# Patient Record
Sex: Female | Born: 1990 | Race: Black or African American | Marital: Single | Smoking: Never smoker
Health system: Southern US, Community
[De-identification: ages and names within clinical notes are randomized; demographics above are authoritative.]

---

## 2012-09-01 ENCOUNTER — Emergency Department (HOSPITAL_COMMUNITY)
Admission: EM | Admit: 2012-09-01 | Discharge: 2012-09-01 | Disposition: A | Discharge status: Home / Self Care | Attending: Emergency Medicine | Admitting: Emergency Medicine

## 2012-09-01 DIAGNOSIS — Z3202 Encounter for pregnancy test, result negative: Secondary | ICD-10-CM | POA: Insufficient documentation

## 2012-09-01 DIAGNOSIS — N939 Abnormal uterine and vaginal bleeding, unspecified: Secondary | ICD-10-CM

## 2012-09-01 DIAGNOSIS — N898 Other specified noninflammatory disorders of vagina: Secondary | ICD-10-CM | POA: Insufficient documentation

## 2012-09-01 DIAGNOSIS — N390 Urinary tract infection, site not specified: Secondary | ICD-10-CM | POA: Insufficient documentation

## 2012-09-01 LAB — URINALYSIS, ROUTINE W REFLEX MICROSCOPIC
Bilirubin Urine: NEGATIVE
Ketones, ur: NEGATIVE mg/dL
Nitrite: POSITIVE — AB
Protein, ur: 100 mg/dL — AB

## 2012-09-01 LAB — WET PREP, GENITAL: WBC, Wet Prep HPF POC: NONE SEEN

## 2012-09-01 MED ORDER — CEPHALEXIN 500 MG PO CAPS: 500.0000 mg | ORAL_CAPSULE | Freq: Two times a day (BID) | ORAL | Status: DC

## 2012-09-01 NOTE — ED Notes (Signed)
Pt st's she and her female partner was using a dildo and pt started having vaginal bleeding afterwards.

## 2012-09-01 NOTE — ED Notes (Signed)
The patient is AOx4 and comfortable with the discharge instructions. 

## 2012-09-01 NOTE — ED Notes (Signed)
Pt c/o pain when voiding and blood when on tissue when wiping. Pt states it started as a little bit of blood and has increased to moderate amount.

## 2012-09-02 LAB — GC/CHLAMYDIA PROBE AMP: GC Probe RNA: NEGATIVE

## 2012-09-02 LAB — URINE CULTURE: Culture: NO GROWTH

## 2012-09-02 NOTE — ED Provider Notes (Signed)
History     CSN: [id number]  Arrival date & time 09/01/12  2136   First MD Initiated Contact with Patient 09/01/12 2209      Chief Complaint  Patient presents with  . Vaginal Bleeding     HPI 22 year old female, last menstrual period about 3 weeks ago presents complaining of vaginal bleeding. Onset was about 5 days ago. She's been having minimal vaginal bleeding described as a small amount of blood when she wipes that has gradually increased but is still less than normal menstrual period. She has no pain. She reports that she is worried that she's injured herself. She is sexually active with females only and states that she had vaginal penetration with a "sexual toy" 2 days ago and that's when her bleeding started.  She denies abdominal pain, nausea, vomiting, dysuria, urinary frequency, urinary urgency, back pain, or any other symptoms.   No past medical history on file.  No past surgical history on file.  No family history on file.  History  Substance Use Topics  . Smoking status: Not on file  . Smokeless tobacco: Not on file  . Alcohol Use: Not on file    OB History   No data available      Review of Systems  Constitutional: Negative for fever, chills and diaphoresis.  HENT: Negative for congestion, rhinorrhea, neck pain and neck stiffness.   Respiratory: Negative for cough, shortness of breath and wheezing.   Cardiovascular: Negative for chest pain and leg swelling.  Gastrointestinal: Negative for nausea, vomiting, abdominal pain and diarrhea.  Genitourinary: Positive for vaginal bleeding. Negative for dysuria, urgency, frequency, flank pain, vaginal discharge and difficulty urinating.  Skin: Negative for rash.  Neurological: Negative for weakness, numbness and headaches.  All other systems reviewed and are negative.    Allergies  Review of patient's allergies indicates no known allergies.  Home Medications   Current Outpatient Rx  Name  Route  Sig   Dispense  Refill  . cephALEXin (KEFLEX) 500 MG capsule   Oral   Take 1 capsule (500 mg total) by mouth 2 (two) times daily.   28 capsule   0     For 14 days     BP 123/73  Pulse 79  Temp(Src) 98.4 F (36.9 C) (Oral)  Resp 16  SpO2 100%  LMP 08/15/2012  Physical Exam  Nursing note and vitals reviewed. Constitutional: She is oriented to person, place, and time. She appears well-developed and well-nourished. No distress.  HENT:  Head: Normocephalic and atraumatic.  Mouth/Throat: Oropharynx is clear and moist.  Eyes: Conjunctivae and EOM are normal. Pupils are equal, round, and reactive to light. No scleral icterus.  Neck: Normal range of motion. Neck supple. No JVD present.  Cardiovascular: Normal rate, regular rhythm, normal heart sounds and intact distal pulses.  Exam reveals no gallop and no friction rub.   No murmur heard. Pulmonary/Chest: Effort normal and breath sounds normal. No respiratory distress. She has no wheezes. She has no rales.  Abdominal: Soft. Bowel sounds are normal. She exhibits no distension. There is no tenderness. There is no rebound and no guarding.  Genitourinary: There is no lesion or injury on the right labia. There is no lesion or injury on the left labia. Cervix exhibits no motion tenderness, no discharge and no friability. Right adnexum displays no mass, no tenderness and no fullness. Left adnexum displays no mass, no tenderness and no fullness. There is bleeding (minimal) around the vagina. No erythema or tenderness  around the vagina. No foreign body around the vagina. No signs of injury around the vagina. No vaginal discharge found.  Musculoskeletal: She exhibits no edema.  Neurological: She is alert and oriented to person, place, and time. No cranial nerve deficit. She exhibits normal muscle tone. Coordination normal.  Skin: Skin is warm and dry. She is not diaphoretic.    ED Course  Procedures (including critical care time)  Labs Reviewed  WET  PREP, GENITAL - Abnormal; Notable for the following:    Clue Cells Wet Prep HPF POC FEW (*)    All other components within normal limits  URINALYSIS, ROUTINE W REFLEX MICROSCOPIC - Abnormal; Notable for the following:    APPearance TURBID (*)    Hgb urine dipstick LARGE (*)    Protein, ur 100 (*)    Nitrite POSITIVE (*)    Leukocytes, UA LARGE (*)    All other components within normal limits  URINE MICROSCOPIC-ADD ON - Abnormal; Notable for the following:    Bacteria, UA MANY (*)    All other components within normal limits  URINE CULTURE  GC/CHLAMYDIA PROBE AMP  POCT PREGNANCY, URINE   No results found.   1. Vaginal bleeding   2. UTI (lower urinary tract infection)       MDM   22 year old female presents complaining of vaginal bleeding after she had vaginal penetration with a "sexual toy." She has mild bleeding. No pain. No other symptoms.  On exam she is well-appearing in no acute distress.  patient has normal vital signs. Her abdominal exam is benign. Her vaginal exam demonstrates no injury with normal vaginal mucosa and minimal blood in the vaginal vault.  Pregnancy test negative. UA demonstrates findings concerning for UTI.  She is treated with Keflex and given a prescription for the same and advised to followup with her gynecologist for reevaluation.        Toney Sang, MD 09/02/12 1301

## 2012-09-02 NOTE — ED Provider Notes (Signed)
I have personally seen and examined the patient.  I have discussed the plan of care with the resident.  I have reviewed the documentation on PMH/FH/Soc. History.  I have reviewed the documentation of the resident and agree.  pt well appearing, no distress, she feels comfortable for d/c home and will be treated for uti   Joya Gaskins, MD 09/02/12 2338

## 2016-05-26 ENCOUNTER — Encounter (HOSPITAL_COMMUNITY): Payer: Self-pay | Admitting: Emergency Medicine

## 2016-05-26 ENCOUNTER — Emergency Department (HOSPITAL_COMMUNITY): Payer: Self-pay

## 2016-05-26 ENCOUNTER — Emergency Department (HOSPITAL_COMMUNITY)
Admission: EM | Admit: 2016-05-26 | Discharge: 2016-05-27 | Disposition: A | Discharge status: Home / Self Care | Payer: Self-pay | Attending: Emergency Medicine | Admitting: Emergency Medicine

## 2016-05-26 DIAGNOSIS — R109 Unspecified abdominal pain: Secondary | ICD-10-CM

## 2016-05-26 DIAGNOSIS — R1084 Generalized abdominal pain: Secondary | ICD-10-CM | POA: Insufficient documentation

## 2016-05-26 LAB — URINALYSIS, ROUTINE W REFLEX MICROSCOPIC
Bilirubin Urine: NEGATIVE
Glucose, UA: NEGATIVE mg/dL
HGB URINE DIPSTICK: NEGATIVE
Ketones, ur: 5 mg/dL — AB
Leukocytes, UA: NEGATIVE
NITRITE: NEGATIVE
PROTEIN: 30 mg/dL — AB
SPECIFIC GRAVITY, URINE: 1.027 (ref 1.005–1.030)
pH: 5 (ref 5.0–8.0)

## 2016-05-26 LAB — CBC
HCT: 38 % (ref 36.0–46.0)
HEMOGLOBIN: 12.1 g/dL (ref 12.0–15.0)
MCH: 21.6 pg — ABNORMAL LOW (ref 26.0–34.0)
MCHC: 31.8 g/dL (ref 30.0–36.0)
MCV: 67.7 fL — ABNORMAL LOW (ref 78.0–100.0)
Platelets: 310 10*3/uL (ref 150–400)
RBC: 5.61 MIL/uL — ABNORMAL HIGH (ref 3.87–5.11)
RDW: 17.3 % — AB (ref 11.5–15.5)
WBC: 13.5 10*3/uL — ABNORMAL HIGH (ref 4.0–10.5)

## 2016-05-26 LAB — I-STAT BETA HCG BLOOD, ED (MC, WL, AP ONLY)

## 2016-05-26 LAB — COMPREHENSIVE METABOLIC PANEL
ALBUMIN: 4.4 g/dL (ref 3.5–5.0)
ALK PHOS: 79 U/L (ref 38–126)
ALT: 11 U/L — AB (ref 14–54)
AST: 17 U/L (ref 15–41)
Anion gap: 6 (ref 5–15)
BILIRUBIN TOTAL: 0.4 mg/dL (ref 0.3–1.2)
BUN: 9 mg/dL (ref 6–20)
CALCIUM: 9 mg/dL (ref 8.9–10.3)
CO2: 26 mmol/L (ref 22–32)
Chloride: 104 mmol/L (ref 101–111)
Creatinine, Ser: 0.77 mg/dL (ref 0.44–1.00)
GFR calc Af Amer: >60 mL/min (ref >60)
GFR calc non Af Amer: >60 mL/min (ref >60)
GLUCOSE: 93 mg/dL (ref 65–99)
Potassium: 3.5 mmol/L (ref 3.5–5.1)
SODIUM: 136 mmol/L (ref 135–145)
TOTAL PROTEIN: 8.5 g/dL — AB (ref 6.5–8.1)

## 2016-05-26 LAB — LIPASE, BLOOD: Lipase: 24 U/L (ref 11–51)

## 2016-05-26 MED ORDER — IOPAMIDOL (ISOVUE-300) INJECTION 61%
100.0000 mL | Freq: Once | INTRAVENOUS | Status: AC | PRN
  Administered 2016-05-26: 100 mL via INTRAVENOUS

## 2016-05-26 MED ORDER — IOPAMIDOL (ISOVUE-300) INJECTION 61%
INTRAVENOUS | Status: AC
  Filled 2016-05-26: qty 100

## 2016-05-26 MED ORDER — SODIUM CHLORIDE 0.9 % IJ SOLN
INTRAMUSCULAR | Status: AC
  Filled 2016-05-26: qty 50

## 2016-05-26 NOTE — ED Triage Notes (Signed)
Pt complaint of generalized abdominal pain WITHOUT n/v/d onset last night; denies GU symptoms.

## 2016-05-26 NOTE — ED Provider Notes (Signed)
[facility]-EMERGENCY DEPT Provider Note   CSN: [id number] Arrival date & time: 05/26/16  1447  By signing my name below, I, Holly Case, attest that this documentation has been prepared under the direction and in the presence of non-physician practitioner, Trixie Dredge, PA-C. Electronically Signed: Nelwyn Case, Scribe. 05/26/2016. 10:23 PM.  History   Chief Complaint Chief Complaint  Patient presents with  . Abdominal Pain   The history is provided by the patient. No language interpreter was used.    HPI Comments:  Holly Case is an otherwise healthy 26 y.o. female who presents to the Emergency Department complaining of constant, mild generalized abdominal pain beginning last night. Pt states that her pain is worse directly above her bellybutton and exacerbated on palpation and when she bends forward. She reports associated urinary frequency. Pt has not tried anything at home for her symptoms. She denies any fevers, chills, myalgias, nausea, vomiting, dysuria, vaginal discharge, vaginal bleeding, diarrhea or constipation. Pt also denies any lifestyle changes or recent trauma to the area. Her LNMP was in December 2017, which she states is normal for her. Pt has no abdominal pshx.   History reviewed. No pertinent past medical history.  There are no active problems to display for this patient.   History reviewed. No pertinent surgical history.  OB History    No data available       Home Medications    Prior to Admission medications   Medication Sig Start Date End Date Taking? Authorizing Provider  cephALEXin (KEFLEX) 500 MG capsule Take 1 capsule (500 mg total) by mouth 2 (two) times daily. Patient not taking: Reported on 05/26/2016 09/01/12   Toney Sang, MD    Family History No family history on file.  Social History Social History  Substance Use Topics  . Smoking status: Never Smoker  . Smokeless tobacco: Not on file  . Alcohol use No     Allergies   Patient has no  known allergies.   Review of Systems Review of Systems  Constitutional: Negative for chills and fever.  Gastrointestinal: Positive for abdominal pain. Negative for constipation, diarrhea, nausea and vomiting.  Genitourinary: Positive for frequency. Negative for dysuria, vaginal bleeding and vaginal discharge.  Musculoskeletal: Negative for myalgias.  All other systems reviewed and are negative.    Physical Exam Updated Vital Signs BP 134/84 (BP Location: Right Arm)   Pulse 91   Temp 98.1 F (36.7 C) (Oral)   Resp 18   Wt 101.6 kg   LMP 04/02/2016   SpO2 100%   Physical Exam  Constitutional: She appears well-developed and well-nourished. No distress.  HENT:  Head: Normocephalic and atraumatic.  Neck: Neck supple.  Cardiovascular: Normal rate and regular rhythm.   Pulmonary/Chest: Effort normal and breath sounds normal. No respiratory distress. She has no wheezes. She has no rales.  Abdominal: Soft. She exhibits no distension. There is tenderness in the right upper quadrant and right lower quadrant. There is no rebound and no guarding.  Generalized abdominal pain worse on RLQ.   Neurological: She is alert.  Skin: She is not diaphoretic.  Nursing note and vitals reviewed.    ED Treatments / Results  DIAGNOSTIC STUDIES:  Oxygen Saturation is 100% on RA, normal by my interpretation.    COORDINATION OF CARE:  10:28 PM Discussed treatment plan with pt at bedside which includes urinalysis and imaging and pt agreed to plan.  12:10 AM Discussed CT results and blood work with patient.  Labs (all labs ordered are  listed, but only abnormal results are displayed) Labs Reviewed  COMPREHENSIVE METABOLIC PANEL - Abnormal; Notable for the following:       Result Value   Total Protein 8.5 (*)    ALT 11 (*)    All other components within normal limits  CBC - Abnormal; Notable for the following:    WBC 13.5 (*)    RBC 5.61 (*)    MCV 67.7 (*)    MCH 21.6 (*)    RDW 17.3 (*)     All other components within normal limits  URINALYSIS, ROUTINE W REFLEX MICROSCOPIC - Abnormal; Notable for the following:    APPearance CLOUDY (*)    Ketones, ur 5 (*)    Protein, ur 30 (*)    Bacteria, UA RARE (*)    Squamous Epithelial / LPF 6-30 (*)    All other components within normal limits  URINE CULTURE  LIPASE, BLOOD  I-STAT BETA HCG BLOOD, ED ([facility], [facility], [facility] ONLY)    EKG  EKG Interpretation None       Radiology Ct Abdomen Pelvis W Contrast  Result Date: 05/26/2016 CLINICAL DATA:  Generalized abdominal pain EXAM: CT ABDOMEN AND PELVIS WITH CONTRAST TECHNIQUE: Multidetector CT imaging of the abdomen and pelvis was performed using the standard protocol following bolus administration of intravenous contrast. CONTRAST:  100mL ISOVUE-300 IOPAMIDOL (ISOVUE-300) INJECTION 61% COMPARISON:  None. FINDINGS: Lower chest: No acute abnormality. Hepatobiliary: Subcentimeter hypodense lesion within the right hepatic lobe, too small to further characterize. No gallstones, gallbladder wall thickening, or biliary dilatation. Pancreas: Unremarkable. No pancreatic ductal dilatation or surrounding inflammatory changes. Spleen: Normal in size without focal abnormality. Adrenals/Urinary Tract: Adrenal glands are unremarkable. Kidneys are normal, without renal calculi, focal lesion, or hydronephrosis. Bladder is unremarkable. Stomach/Bowel: Stomach is within normal limits. Appendix appears normal. No evidence of bowel wall thickening, distention, or inflammatory changes. Vascular/Lymphatic: No significant vascular findings are present. No enlarged abdominal or pelvic lymph nodes. Reproductive: Uterus and bilateral adnexa are unremarkable. Other: No abdominal wall hernia or abnormality. No abdominopelvic ascites. Musculoskeletal: No acute or significant osseous findings. IMPRESSION: No CT evidence for acute intra-abdominal or pelvic pathology. Electronically Signed   By: Jasmine PangKim  Fujinaga M.D.   On: 05/26/2016  23:45    Procedures Procedures (including critical care time)  Medications Ordered in ED Medications  iopamidol (ISOVUE-300) 61 % injection (not administered)  sodium chloride 0.9 % injection (not administered)  iopamidol (ISOVUE-300) 61 % injection 100 mL (100 mLs Intravenous Contrast Given 05/26/16 2310)     Initial Impression / Assessment and Plan / ED Course  I have reviewed the triage vital signs and the nursing notes.  Pertinent labs & imaging results that were available during my care of the patient were reviewed by me and considered in my medical decision making (see chart for details).     Afebrile, nontoxic patient with abdominal pain and increased urination.  Labs significant for leukocytosis.  UA does not appear infected.  Engaged in joint medical decision making with the patient regarding imaging - CT ordered given more significant right sided pain.  Discussed empiric antibiotics vs urine culture and await results.  Pt notes abdominal pain is more superior, she is diffusely tender though moreso on the right.  Doubt ovarian torsion.  CT negative for acute pathology, appendix visualized and is normal.  D/C home.   Discussed result, findings, treatment, and follow up  with patient.  Pt given return precautions.  Pt verbalizes understanding and agrees with plan.  Final Clinical Impressions(s) / ED Diagnoses   Final diagnoses:  Abdominal pain, unspecified abdominal location    New Prescriptions New Prescriptions   No medications on file    I personally performed the services described in this documentation, which was scribed in my presence. The recorded information has been reviewed and is accurate.     Trixie Dredge, PA-C 05/27/16 0020    Azalia Bilis, MD 05/27/16 [phone]

## 2016-05-27 NOTE — Discharge Instructions (Signed)
Read the information below.  You may return to the Emergency Department at any time for worsening condition or any new symptoms that concern you.  If you develop high fevers, worsening abdominal pain, uncontrolled vomiting, or are unable to tolerate fluids by mouth, return to the ER for a recheck.   °

## 2016-05-28 LAB — URINE CULTURE

## 2016-07-15 ENCOUNTER — Encounter (HOSPITAL_COMMUNITY): Payer: Self-pay

## 2016-07-15 ENCOUNTER — Emergency Department (HOSPITAL_COMMUNITY)
Admission: EM | Admit: 2016-07-15 | Discharge: 2016-07-15 | Disposition: A | Discharge status: Home / Self Care | Attending: Emergency Medicine | Admitting: Emergency Medicine

## 2016-07-15 DIAGNOSIS — R1084 Generalized abdominal pain: Secondary | ICD-10-CM | POA: Insufficient documentation

## 2016-07-15 DIAGNOSIS — R109 Unspecified abdominal pain: Secondary | ICD-10-CM

## 2016-07-15 LAB — POC URINE PREG, ED: PREG TEST UR: NEGATIVE

## 2016-07-15 MED ORDER — IBUPROFEN 600 MG PO TABS: 600.0000 mg | ORAL_TABLET | Freq: Four times a day (QID) | ORAL | 0 refills | Status: AC | PRN

## 2016-07-15 MED ORDER — CYCLOBENZAPRINE HCL ER 15 MG PO CP24: 15.0000 mg | ORAL_CAPSULE | Freq: Every day | ORAL | 0 refills | Status: AC | PRN

## 2016-07-15 NOTE — ED Notes (Signed)
Patient Alert and oriented X4. Stable and ambulatory. Patient verbalized understanding of the discharge instructions.  Patient belongings were taken by the patient.  

## 2016-07-15 NOTE — ED Triage Notes (Signed)
Right side pain after lifting boxes at work. Worse with exertion

## 2016-07-15 NOTE — ED Notes (Signed)
Patient only has pain when bending over

## 2016-07-15 NOTE — ED Provider Notes (Signed)
[facility]-EMERGENCY DEPT Provider Note   CSN: [id number] Arrival date & time: 07/15/16  0007     History   Chief Complaint Chief Complaint  Patient presents with  . Muscle Pain    HPI Holly Case is a 26 y.o. female who presents to the Emergency Department with complaints of constant, non-radiating right-sided flank pain that began this afternoon. She reports that she lifts boxes for hours at a time at her job, but denies exertion during the initial onset of pain. She also c/o of generalized, non-radiating epigastric pain and nausea over the last weeks. Denies dysuria, frequency, vaginal bleeding, vaginal discharge, and back pain.    LMP was February 18. She states she is not currently taking birth control and is sexually active. No previous abdominal surgeries. PMH includes UTIs. No h/o of nephrolithiasis. No daily medications.   HPI  History reviewed. No pertinent past medical history.  There are no active problems to display for this patient.   No past surgical history on file.  OB History    No data available      Home Medications    Prior to Admission medications   Medication Sig Start Date End Date Taking? Authorizing Provider  cyclobenzaprine (AMRIX) 15 MG 24 hr capsule Take 1 capsule (15 mg total) by mouth daily as needed for muscle spasms. 07/15/16   Simrat Kendrick Conan Bowens, PA-C  ibuprofen (ADVIL,MOTRIN) 600 MG tablet Take 1 tablet (600 mg total) by mouth every 6 (six) hours as needed. 07/15/16   Adasyn Mcadams Conan Bowens, PA-C   Family History No family history on file.  Social History Social History  Substance Use Topics  . Smoking status: Never Smoker  . Smokeless tobacco: Never Used  . Alcohol use No     Allergies   Patient has no known allergies.   Review of Systems Review of Systems  Constitutional: Negative for activity change, chills and fever.  Eyes: Negative for visual disturbance.  Respiratory: Negative for shortness of breath.   Cardiovascular:  Negative for chest pain.  Gastrointestinal: Positive for abdominal pain and nausea. Negative for diarrhea and vomiting.  Genitourinary: Positive for flank pain. Negative for dysuria, hematuria, urgency, vaginal bleeding and vaginal discharge.  Musculoskeletal: Negative for back pain.  Allergic/Immunologic: Negative for immunocompromised state.  Neurological: Negative for headaches.  Psychiatric/Behavioral: Negative for confusion.   Physical Exam Updated Vital Signs BP (!) 147/88 (BP Location: Left Arm)   Pulse 92   Temp 98 F (36.7 C) (Oral)   Resp 18   Ht 5\' 4"  (1.626 m)   Wt 95.3 kg   LMP 06/07/2016   SpO2 100%   BMI 36.05 kg/m   Physical Exam  Constitutional: She is oriented to person, place, and time. She appears well-developed and well-nourished. No distress.  HENT:  Head: Normocephalic and atraumatic.  Eyes: Conjunctivae are normal.  Neck: Neck supple.  Cardiovascular: Normal rate and regular rhythm.   No murmur heard. Pulmonary/Chest: Effort normal and breath sounds normal. No respiratory distress.  Abdominal: Soft. There is tenderness. There is no guarding.  Minimally TTP over bilateral upper quadrants. Minimally TTP over the right flank. No left flank tenderness. Normoactive bowel sounds.   Musculoskeletal: Normal range of motion. She exhibits no edema, tenderness or deformity.  Neurological: She is alert and oriented to person, place, and time. No sensory deficit. She exhibits normal muscle tone. Coordination normal.  Skin: Skin is warm and dry.  Psychiatric: She has a normal mood and affect.  Nursing note and  vitals reviewed.  ED Treatments / Results  Labs (all labs ordered are listed, but only abnormal results are displayed) Labs Reviewed  POC URINE PREG, ED    EKG  EKG Interpretation None       Radiology No results found.  Procedures Procedures (including critical care time)  Medications Ordered in ED Medications - No data to  display   Initial Impression / Assessment and Plan / ED Course  I have reviewed the triage vital signs and the nursing notes.  Pertinent labs & imaging results that were available during my care of the patient were reviewed by me and considered in my medical decision making (see chart for details).     26 y.o. female with right flank pain.  NAD. The patient is actively in the rooming watching tv and giggling with her two friends accompanying her. No neurological deficits and normal neuro exam.  Patient can walk.  No loss of bowel or bladder control. No fever, chills, urinary symptoms, or vaginal complaints. The patient is not currently taking birth control and is concerned she might be pregnant. Pregnancy test is negative. Low suspicion for ectopic pregnancy or ovarian torsion.   RICE protocol and pain medicine indicated and discussed with patient. Discussed reasons to return to the Emergency Department. The patient acknowledges the plan and is agreeable at this time.    Final Clinical Impressions(s) / ED Diagnoses   Final diagnoses:  Flank pain    New Prescriptions New Prescriptions   CYCLOBENZAPRINE (AMRIX) 15 MG 24 HR CAPSULE    Take 1 capsule (15 mg total) by mouth daily as needed for muscle spasms.   IBUPROFEN (ADVIL,MOTRIN) 600 MG TABLET    Take 1 tablet (600 mg total) by mouth every 6 (six) hours as needed.     Eligah EastMia Adair Darcella Shiffman, PA-C 07/15/16 0149    Tilden FossaElizabeth Rees, MD 07/15/16 [phone]

## 2017-01-29 ENCOUNTER — Emergency Department (HOSPITAL_COMMUNITY)
Admission: EM | Admit: 2017-01-29 | Discharge: 2017-01-29 | Disposition: A | Discharge status: Home / Self Care | Attending: Emergency Medicine | Admitting: Emergency Medicine

## 2017-01-29 ENCOUNTER — Encounter (HOSPITAL_COMMUNITY): Payer: Self-pay

## 2017-01-29 DIAGNOSIS — R599 Enlarged lymph nodes, unspecified: Secondary | ICD-10-CM | POA: Insufficient documentation

## 2017-01-29 NOTE — ED Triage Notes (Signed)
Pt states 2 days ago she notice a "knot that is on the left side of pelvis; pt state it hurts upon walking and currently at 8/10 on arrival. Pt states it feel like a small knot under the skin. Pt is a&ox 4 on arrival. Pt ambulate to room- [facility]

## 2017-01-29 NOTE — ED Provider Notes (Signed)
[facility]-EMERGENCY DEPT Provider Note   CSN: [id number] Arrival date & time: 01/29/17  2056     History   Chief Complaint Chief Complaint  Patient presents with  . Pelvic Pain    HPI Holly Case is a 26 y.o. female who presents to the ED with pain in the left groin area that started a few days ago and has continued. Patient denies in sores in the genital area, vaginal d/c or vaginal bleeding. She does shave her pubic area and she noticed the area after that. She denies fever, chills or any other problems. She has never had a pap smear.   HPI  History reviewed. No pertinent past medical history.  There are no active problems to display for this patient.   History reviewed. No pertinent surgical history.  OB History    No data available       Home Medications    Prior to Admission medications   Medication Sig Start Date End Date Taking? Authorizing Provider  cyclobenzaprine (AMRIX) 15 MG 24 hr capsule Take 1 capsule (15 mg total) by mouth daily as needed for muscle spasms. 07/15/16   McDonald, Mia A, PA-C  ibuprofen (ADVIL,MOTRIN) 600 MG tablet Take 1 tablet (600 mg total) by mouth every 6 (six) hours as needed. 07/15/16   McDonald, Coral Else, PA-C    Family History History reviewed. No pertinent family history.  Social History Social History  Substance Use Topics  . Smoking status: Never Smoker  . Smokeless tobacco: Never Used  . Alcohol use No     Allergies   Patient has no known allergies.   Review of Systems Review of Systems  Constitutional: Negative for chills and fever.  HENT: Negative.   Gastrointestinal: Negative for abdominal pain, nausea and vomiting.  Genitourinary: Negative for dysuria, frequency, urgency, vaginal bleeding, vaginal discharge and vaginal pain.  Skin: Negative for rash.  Hematological: Positive for adenopathy.  Psychiatric/Behavioral: The patient is not nervous/anxious.      Physical Exam Updated Vital Signs BP (!) 132/91 (BP  Location: Left Arm)   Pulse 93   Temp 98 F (36.7 C) (Oral)   Resp 20   Ht  (1.6 m)   LMP 12/04/2016   SpO2 100%   Physical Exam  Constitutional: She appears well-developed and well-nourished. No distress.  HENT:  Head: Normocephalic.  Eyes: EOM are normal.  Neck: Neck supple.  Cardiovascular: Normal rate.   Pulmonary/Chest: Effort normal.  Abdominal: Soft. There is no tenderness.  Genitourinary:  Genitourinary Comments: There is a pea size node palpated in the left inguinal area that is tender on exam. No lesions noted in the pubic area.   Musculoskeletal: Normal range of motion.  Lymphadenopathy:       Left: Inguinal adenopathy present.  Neurological: She is alert.  Skin: Skin is warm and dry.  Psychiatric: She has a normal mood and affect. Her behavior is normal.  Nursing note and vitals reviewed.    ED Treatments / Results  Labs (all labs ordered are listed, but only abnormal results are displayed) Labs Reviewed - No data to display   Radiology No results found.  Procedures Procedures (including critical care time)  Medications Ordered in ED Medications - No data to display   Initial Impression / Assessment and Plan / ED Course  I have reviewed the triage vital signs and the nursing notes.  26 y.o. female with enlarged lymph node left inguinal area stable for d/c to f/u with [facility] for Pap Smear and recheck of lymph node. Discussed with the patient reasons for enlarged lymph nodes and need for follow up. Patient agrees with plan.   Final Clinical Impressions(s) / ED Diagnoses   Final diagnoses:  Lymph nodes enlarged    New Prescriptions New Prescriptions   No medications on file     Janne Napoleon, NP 01/29/17 2315    Kerrie [place], NP 01/29/17 2316    Derwood Kaplan, MD 01/30/17 [phone]

## 2017-01-29 NOTE — Discharge Instructions (Signed)
Call and schedule an appointment with the GYN office at [facility]. Tell them you need a pap smear and follow up of enlarged lymph node in the left groin. If you symptoms worsen or you have other GYN problems before you can get an appointment, go to [facility] hospital emergency room (call Maternity Admissions).

## 2017-02-02 ENCOUNTER — Encounter (HOSPITAL_COMMUNITY): Payer: Self-pay | Admitting: Family Medicine

## 2017-02-02 DIAGNOSIS — A6004 Herpesviral vulvovaginitis: Secondary | ICD-10-CM | POA: Insufficient documentation

## 2017-02-02 DIAGNOSIS — Z79899 Other long term (current) drug therapy: Secondary | ICD-10-CM | POA: Insufficient documentation

## 2017-02-02 DIAGNOSIS — N898 Other specified noninflammatory disorders of vagina: Secondary | ICD-10-CM | POA: Insufficient documentation

## 2017-02-02 NOTE — ED Triage Notes (Signed)
Patient reports she is experiencing vaginal discharge that started 3 days ago. Patient describes the discharge as moderate white, thin discharge. Patient reports she was seen at [facility] on Thursday for possible abscess to her groin but this symptom is different. Denies any fever, nausea, or vomiting.

## 2017-02-03 ENCOUNTER — Emergency Department (HOSPITAL_COMMUNITY)
Admission: EM | Admit: 2017-02-03 | Discharge: 2017-02-03 | Disposition: A | Discharge status: Home / Self Care | Attending: Emergency Medicine | Admitting: Emergency Medicine

## 2017-02-03 DIAGNOSIS — N898 Other specified noninflammatory disorders of vagina: Secondary | ICD-10-CM

## 2017-02-03 DIAGNOSIS — A6004 Herpesviral vulvovaginitis: Secondary | ICD-10-CM

## 2017-02-03 LAB — URINALYSIS, ROUTINE W REFLEX MICROSCOPIC
BACTERIA UA: NONE SEEN
BILIRUBIN URINE: NEGATIVE
GLUCOSE, UA: NEGATIVE mg/dL
HGB URINE DIPSTICK: NEGATIVE
Ketones, ur: NEGATIVE mg/dL
NITRITE: NEGATIVE
PROTEIN: NEGATIVE mg/dL
SPECIFIC GRAVITY, URINE: 1.02 (ref 1.005–1.030)
pH: 6 (ref 5.0–8.0)

## 2017-02-03 LAB — WET PREP, GENITAL
CLUE CELLS WET PREP: NONE SEEN
Sperm: NONE SEEN
TRICH WET PREP: NONE SEEN
Yeast Wet Prep HPF POC: NONE SEEN

## 2017-02-03 LAB — RAPID HIV SCREEN (HIV 1/2 AB+AG)
HIV 1/2 ANTIBODIES: NONREACTIVE
HIV-1 P24 Antigen - HIV24: NONREACTIVE

## 2017-02-03 LAB — GC/CHLAMYDIA PROBE AMP (~~LOC~~) NOT AT ARMC
Chlamydia: NEGATIVE
Neisseria Gonorrhea: NEGATIVE

## 2017-02-03 LAB — RPR: RPR Ser Ql: NONREACTIVE

## 2017-02-03 LAB — POC URINE PREG, ED: PREG TEST UR: NEGATIVE

## 2017-02-03 MED ORDER — AZITHROMYCIN 250 MG PO TABS
1000.0000 mg | ORAL_TABLET | Freq: Once | ORAL | Status: AC
Start: 2017-02-03 — End: 2017-02-03
  Administered 2017-02-03: 1000 mg via ORAL
  Filled 2017-02-03: qty 4

## 2017-02-03 MED ORDER — ACYCLOVIR 400 MG PO TABS: 400.0000 mg | ORAL_TABLET | Freq: Three times a day (TID) | ORAL | 0 refills | Status: AC

## 2017-02-03 MED ORDER — CEFTRIAXONE SODIUM 250 MG IJ SOLR
250.0000 mg | Freq: Once | INTRAMUSCULAR | Status: AC
  Administered 2017-02-03: 250 mg via INTRAMUSCULAR
  Filled 2017-02-03: qty 250

## 2017-02-03 MED ORDER — LIDOCAINE HCL (PF) 1 % IJ SOLN
INTRAMUSCULAR | Status: AC
  Administered 2017-02-03: 1.2 mL
  Filled 2017-02-03: qty 5

## 2017-02-03 MED ORDER — HYDROCODONE-ACETAMINOPHEN 5-325 MG PO TABS: 2.0000 | ORAL_TABLET | ORAL | 0 refills | Status: AC | PRN

## 2017-02-03 MED ORDER — ACYCLOVIR 200 MG PO CAPS
400.0000 mg | ORAL_CAPSULE | Freq: Once | ORAL | Status: AC
  Administered 2017-02-03: 400 mg via ORAL
  Filled 2017-02-03: qty 2

## 2017-02-03 MED ORDER — OXYCODONE-ACETAMINOPHEN 5-325 MG PO TABS
1.0000 | ORAL_TABLET | Freq: Once | ORAL | Status: AC
  Administered 2017-02-03: 1 via ORAL
  Filled 2017-02-03: qty 1

## 2017-02-03 NOTE — Discharge Instructions (Signed)
You ave been diagnosed with herpes in the ED. It is important that she take the acyclovir 3 times a day for 8 days.You also been treated with gonorrhea and chlamydia. These cultures are pending. Your HIV was negative.your syphilis is pending. Usually notified of these results if they are positive. Have given you short course of pain medicine. May take motrin. It is important that she follow-up with her OB/GYN doctor. Have given referral to the women's clinic. Avoid sexual contact and to follow-up with OB/GYN.

## 2017-02-03 NOTE — ED Notes (Addendum)
Pelvic supplies at bedside and pt aware that a urine sample is needed.  Sts she is unable at this time.

## 2017-02-03 NOTE — ED Provider Notes (Signed)
[facility]-EMERGENCY DEPT Provider Note   CSN: [id number] Arrival date & time: 02/02/17  1919     History   Chief Complaint Chief Complaint  Patient presents with  . Vaginal Discharge    HPI Holly Case is a 26 y.o. female.  HPI 26 year old African-American female with no significant past medical history presents to the emergency department today with complaints of vaginal discharge, vaginal lesions. Patient states that her symptoms have been ongoing for the past week. Patient was seen in the ED last week for possible abscess to her groin that was diagnosed as lymphadenopathy and told to follow-up with OB/GYN. Patient states that since then she has developed a white thin discharge that is malodorous. She also reports that she has developed several lesions on her bilateral labia is an in her vagina. These are very tender and painful to touch. Pain is worse with palpation and movement. She has not taken anything for the pain. Patient denies any associated abdominal pain, nausea, emesis, urinary symptoms, change in bowel habits. Patient states that she is sexually active with several female partners. Was unaware if any of her partners have a STD. Does not use protection. Patient denies any associated fevers. Denies any lesions in her mouth. History reviewed. No pertinent past medical history.  There are no active problems to display for this patient.   History reviewed. No pertinent surgical history.  OB History    No data available       Home Medications    Prior to Admission medications   Medication Sig Start Date End Date Taking? Authorizing Provider  acyclovir (ZOVIRAX) 400 MG tablet Take 1 tablet (400 mg total) by mouth 3 (three) times daily. 02/03/17   Rise Mu, PA-C  cyclobenzaprine (AMRIX) 15 MG 24 hr capsule Take 1 capsule (15 mg total) by mouth daily as needed for muscle spasms. 07/15/16   McDonald, Mia A, PA-C    HYDROcodone-acetaminophen (NORCO/VICODIN) 5-325 MG tablet Take 2 tablets by mouth every 4 (four) hours as needed. 02/03/17   Rise Mu, PA-C  ibuprofen (ADVIL,MOTRIN) 600 MG tablet Take 1 tablet (600 mg total) by mouth every 6 (six) hours as needed. 07/15/16   McDonald, Coral Else, PA-C    Family History History reviewed. No pertinent family history.  Social History Social History  Substance Use Topics  . Smoking status: Never Smoker  . Smokeless tobacco: Never Used  . Alcohol use No     Allergies   Patient has no known allergies.   Review of Systems Review of Systems  Constitutional: Negative for chills and fever.  Gastrointestinal: Negative for abdominal pain, diarrhea, nausea and vomiting.  Genitourinary: Positive for genital sores, vaginal discharge and vaginal pain. Negative for difficulty urinating, dysuria, flank pain, frequency, hematuria, pelvic pain, urgency and vaginal bleeding.  Skin: Positive for wound.     Physical Exam Updated Vital Signs BP (!) 148/100   Pulse (!) 104   Temp 98.4 F (36.9 C) (Oral)   Resp 16   Ht  (1.6 m)   Wt 86.2 kg (190 lb)   LMP 12/04/2016   SpO2 100%   BMI 33.66 kg/m   Physical Exam  Constitutional: She appears well-developed and well-nourished. No distress.  HENT:  Head: Normocephalic and atraumatic.  Eyes: Right eye exhibits no discharge. Left eye exhibits no discharge. No scleral icterus.  Neck: Normal range of motion.  Pulmonary/Chest: No respiratory distress.  Abdominal: Soft. Bowel sounds are normal. She exhibits no  distension. There is no tenderness. There is no rebound and no guarding.  Genitourinary: There is lesion on the right labia. There is lesion on the left labia. Cervix exhibits no motion tenderness. Right adnexum displays no mass, no tenderness and no fullness. Left adnexum displays no mass, no tenderness and no fullness. There is erythema and tenderness in the vagina. No bleeding in the vagina.  Vaginal discharge found.  Genitourinary Comments: Chaperone present for exam.  Patient with several vesicular and ulcerated lesions to the bilateral labia as an vaginal vault that are very tender to palpation. Patient does have some mild thin white discharge. This is malodorous. There is no cervical motion tenderness. No vaginal bleeding. Do not appreciate any palpable abscesses.  Musculoskeletal: Normal range of motion.  Neurological: She is alert.  Skin: Skin is warm and dry. Capillary refill takes less than 2 seconds. No pallor.  Psychiatric: Her behavior is normal. Judgment and thought content normal.  Nursing note and vitals reviewed.    ED Treatments / Results  Labs (all labs ordered are listed, but only abnormal results are displayed) Labs Reviewed  WET PREP, GENITAL - Abnormal; Notable for the following:       Result Value   WBC, Wet Prep HPF POC MANY (*)    All other components within normal limits  URINALYSIS, ROUTINE W REFLEX MICROSCOPIC - Abnormal; Notable for the following:    Leukocytes, UA MODERATE (*)    Squamous Epithelial / LPF 0-5 (*)    All other components within normal limits  HSV CULTURE AND TYPING  RAPID HIV SCREEN (HIV 1/2 AB+AG)  RPR  POC URINE PREG, ED  GC/CHLAMYDIA PROBE AMP ([place]) NOT AT [facility]    EKG  EKG Interpretation None       Radiology No results found.  Procedures Procedures (including critical care time)  Medications Ordered in ED Medications  acyclovir (ZOVIRAX) 200 MG capsule 400 mg (400 mg Oral Given 02/03/17 0151)  cefTRIAXone (ROCEPHIN) injection 250 mg (250 mg Intramuscular Given 02/03/17 0154)  azithromycin (ZITHROMAX) tablet 1,000 mg (1,000 mg Oral Given 02/03/17 0156)  oxyCODONE-acetaminophen (PERCOCET/ROXICET) 5-325 MG per tablet 1 tablet (1 tablet Oral Given 02/03/17 0158)  lidocaine (PF) (XYLOCAINE) 1 % injection (1.2 mLs  Given 02/03/17 0155)     Initial Impression / Assessment and Plan / ED Course  I have  reviewed the triage vital signs and the nursing notes.  Pertinent labs & imaging results that were available during my care of the patient were reviewed by me and considered in my medical decision making (see chart for details).     Patient presents to the ED for complaints of vaginal lesions and vaginal discharge. Patient is sexually active with female partners. Does not use protection. Unknown STD exposure. Patient denies any associated fevers, chills, nausea, vomiting, abdominal pain, urinary symptoms.  On exam patient has several ulcerative and vesicular lesions to the vagina. These seem consistent with herpes. They're very tender to palpation. Patient has no cervical motion tenderness or adnexal tenderness that would be concerning for PID or ovarian torsion.Abdominal exam is benign and non tender to palpation. Low suspicion for any intra-abdominal pathology.  UA shows no signs of infection.pregnancy test is negative. Wet prep shows any wbc's but no other abnormalities. Rapid HIV is negative. RPR, gonorrhea and chlamydia cultures are pending. Did order HSV cultures that are pending. Have given patient acyclovir in the ED. We'll prophylactic ally treat her for gonorrhea and chlamydia with Rocephin and azithromycin.  Have encouraged patient have close follow-up with OB/GYN. Encouraged her to have no sexual contact until lesion resolve and follow up with GYN. Patient encouraged to inform all sexual partners that she has been treated for an STD.  Pt is hemodynamically stable, in NAD, & able to ambulate in the ED. Evaluation does not show pathology that would require ongoing emergent intervention or inpatient treatment. I explained the diagnosis to the patient. Pain has been managed & has no complaints prior to dc. Pt is comfortable with above plan and is stable for discharge at this time. All questions were answered prior to disposition. Strict return precautions for f/u to the ED were discussed.  Encouraged follow up with PCP.  Final Clinical Impressions(s) / ED Diagnoses   Final diagnoses:  Vaginal discharge  Herpes simplex vulvovaginitis    New Prescriptions Discharge Medication List as of 02/03/2017  3:08 AM    START taking these medications   Details  acyclovir (ZOVIRAX) 400 MG tablet Take 1 tablet (400 mg total) by mouth 3 (three) times daily., Starting Tue 02/03/2017, Print         Rise Mu, PA-C 02/03/17 0435    Ward, Layla Maw, DO 02/03/17 [phone]

## 2017-02-05 LAB — HSV CULTURE AND TYPING

## 2017-10-30 IMAGING — CT CT ABD-PELV W/ CM
2 of 3 series · 17 of 46 positions shown, 19 images · IV contrast (ISOVUE)
Comparison: None.

CLINICAL DATA: Generalized abdominal pain

EXAM:
CT ABDOMEN AND PELVIS WITH CONTRAST
TECHNIQUE: Multidetector CT imaging of the abdomen and pelvis was performed
using the standard protocol following bolus administration of
intravenous contrast.
CONTRAST:  100mL [id number] IOPAMIDOL ([id number]) INJECTION 61%

[Series 2: abd/pel with · axial · 0.74mm/px · z∈[-980,-574]mm · 14 of 94 slices shown, 16 images]
[im 7/94  soft-tissue]
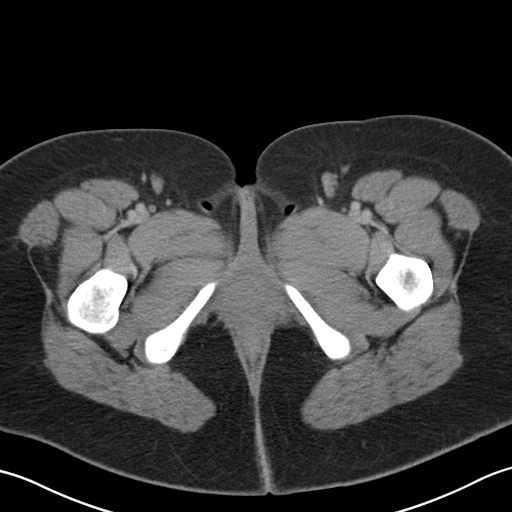
[im 7/94  bone]
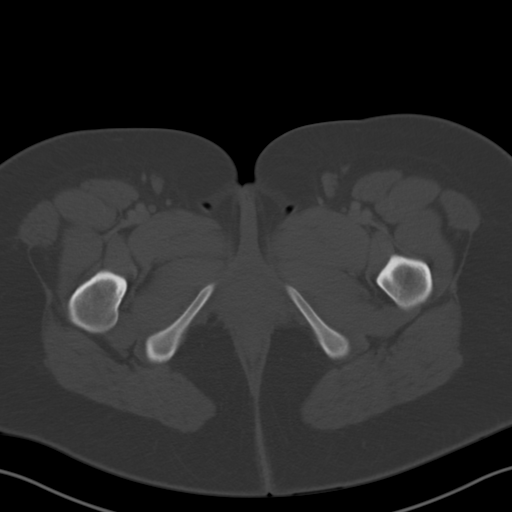
[im 13/94  soft-tissue]
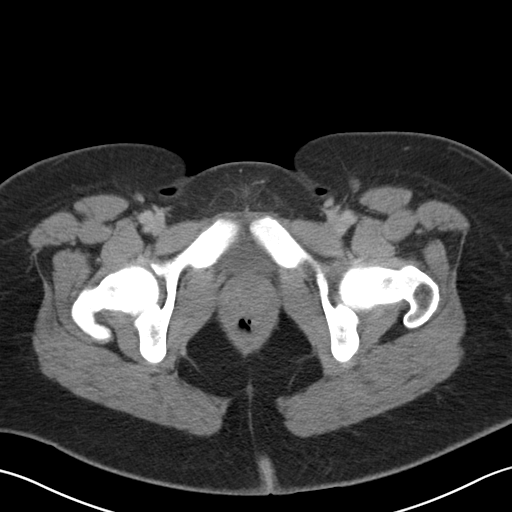
[im 19/94  soft-tissue]
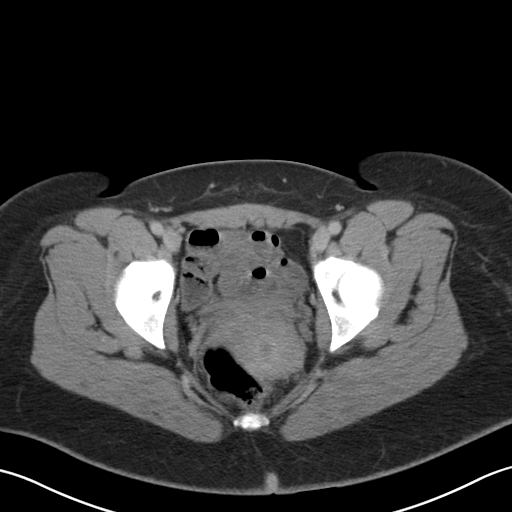
[im 25/94  soft-tissue]
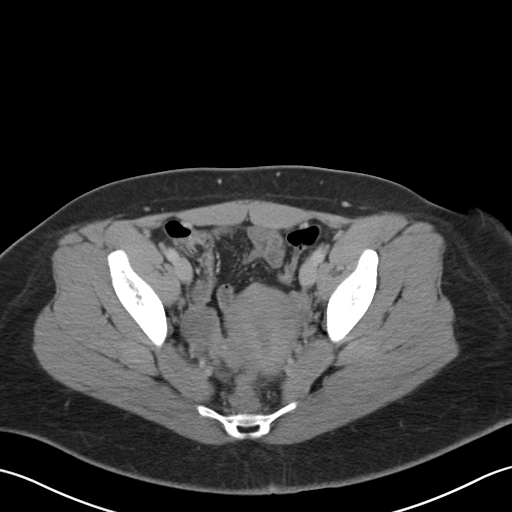
[im 31/94  soft-tissue]
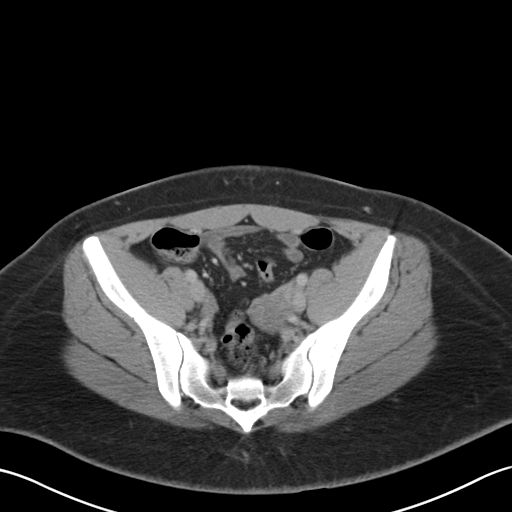
[im 37/94  soft-tissue]
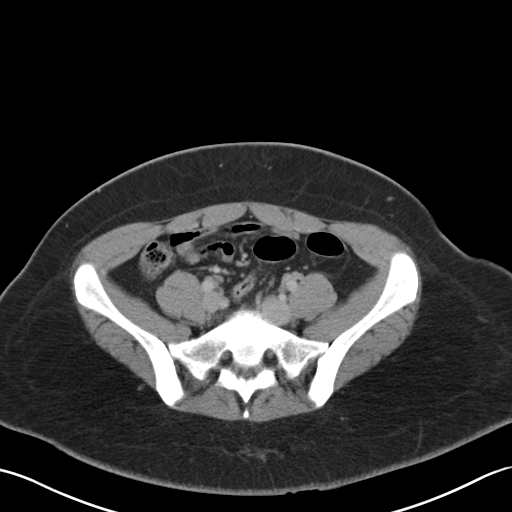
[im 43/94  soft-tissue]
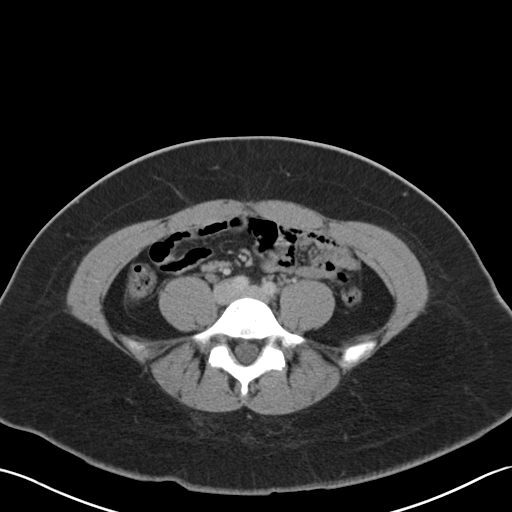
[im 52/94  soft-tissue]
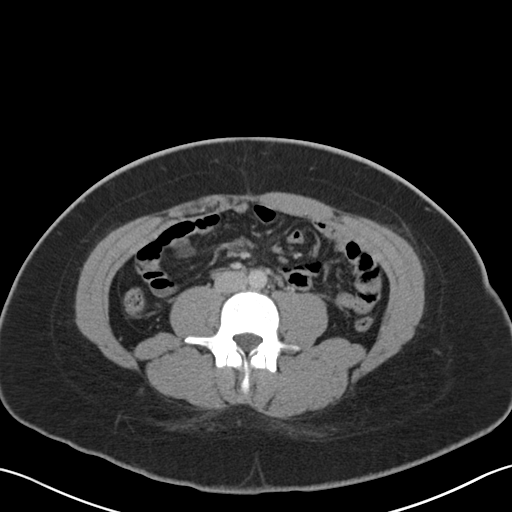
[im 58/94  soft-tissue]
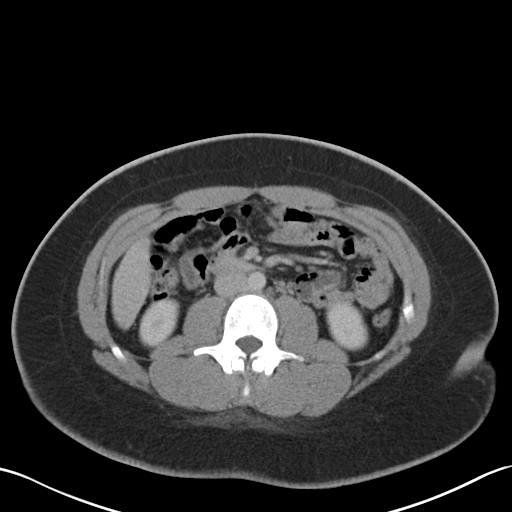
[im 58/94  bone]
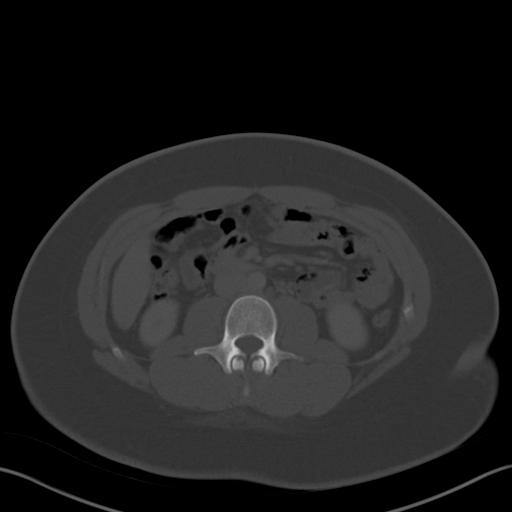
[im 64/94  soft-tissue]
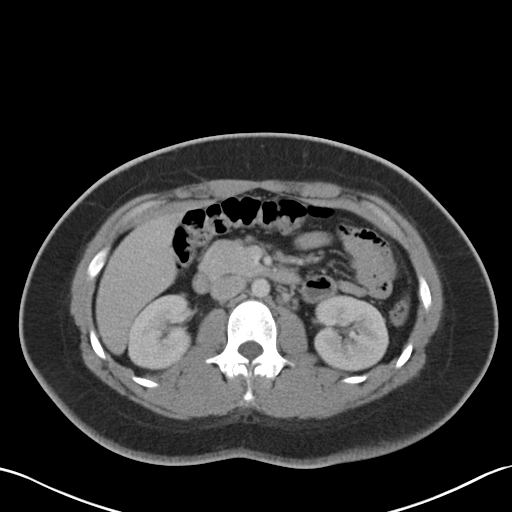
[im 70/94  soft-tissue]
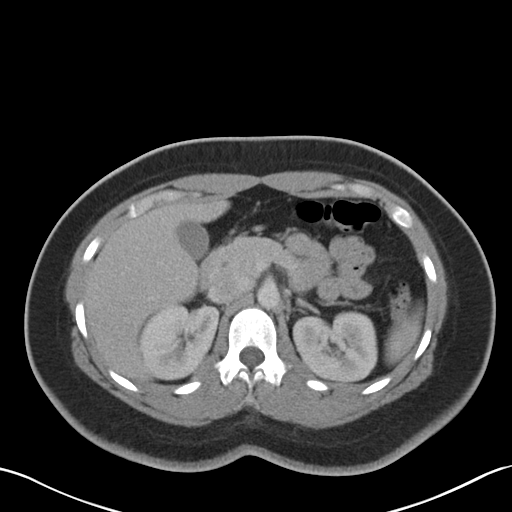
[im 76/94  soft-tissue]
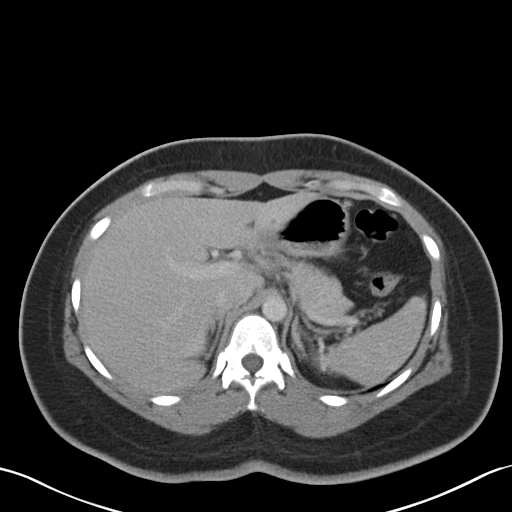
[im 82/94  soft-tissue]
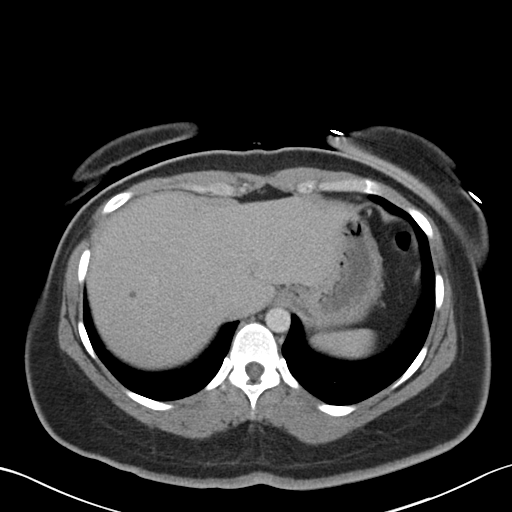
[im 88/94  soft-tissue]
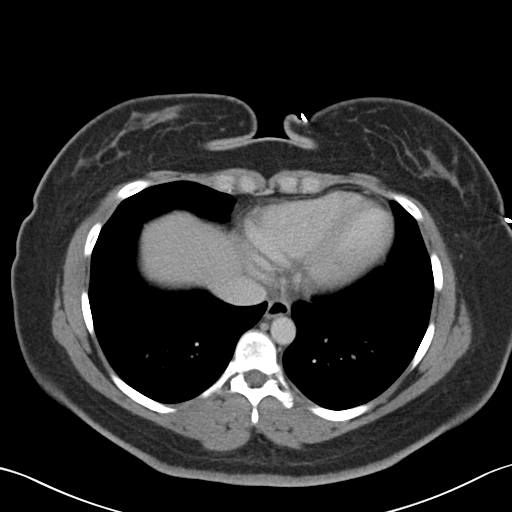

[Series 3: coronal a/|p · coronal · 0.80mm/px · 3 of 153 slices shown]
[im 51/153  soft-tissue]
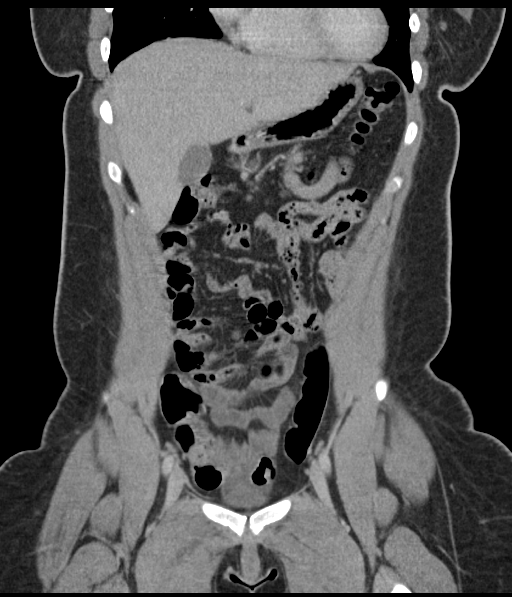
[im 68/153  soft-tissue]
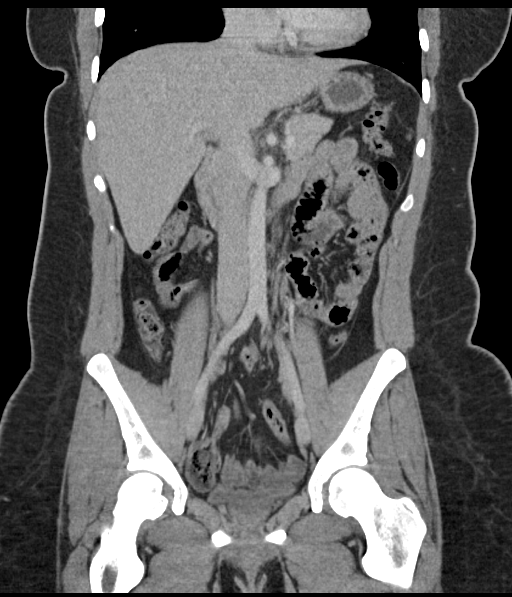
[im 85/153  soft-tissue]
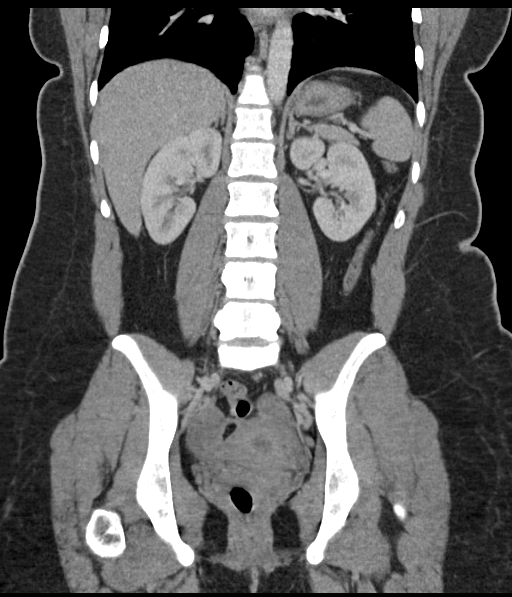

[17 of 46 positions shown; findings below may reference images not displayed]

FINDINGS: Lower chest: No acute abnormality.

Hepatobiliary: Subcentimeter hypodense lesion within the right
hepatic lobe, too small to further characterize. No gallstones,
gallbladder wall thickening, or biliary dilatation.

Pancreas: Unremarkable. No pancreatic ductal dilatation or
surrounding inflammatory changes.

Spleen: Normal in size without focal abnormality.

Adrenals/Urinary Tract: Adrenal glands are unremarkable. Kidneys are
normal, without renal calculi, focal lesion, or hydronephrosis.
Bladder is unremarkable.

Stomach/Bowel: Stomach is within normal limits. Appendix appears
normal. No evidence of bowel wall thickening, distention, or
inflammatory changes.

Vascular/Lymphatic: No significant vascular findings are present. No
enlarged abdominal or pelvic lymph nodes.

Reproductive: Uterus and bilateral adnexa are unremarkable.

Other: No abdominal wall hernia or abnormality. No abdominopelvic
ascites.

Musculoskeletal: No acute or significant osseous findings.
IMPRESSION: No CT evidence for acute intra-abdominal or pelvic pathology.
